# Patient Record
Sex: Female | Born: 1985 | Race: White | Hispanic: No | Marital: Married | Smoking: Never smoker
Health system: Southern US, Community
[De-identification: ages and names within clinical notes are randomized; demographics above are authoritative.]

---

## 2010-04-22 ENCOUNTER — Encounter: Payer: Self-pay | Admitting: Obstetrics and Gynecology

## 2010-08-26 ENCOUNTER — Observation Stay: Payer: Self-pay | Admitting: Obstetrics and Gynecology

## 2010-10-25 ENCOUNTER — Observation Stay: Payer: Self-pay

## 2010-10-26 ENCOUNTER — Inpatient Hospital Stay: Payer: Self-pay | Admitting: Obstetrics and Gynecology

## 2011-06-09 ENCOUNTER — Encounter: Payer: Self-pay | Admitting: Maternal & Fetal Medicine

## 2011-09-21 ENCOUNTER — Observation Stay: Payer: Self-pay

## 2011-09-21 LAB — URINALYSIS, COMPLETE
Bilirubin,UR: NEGATIVE
Blood: NEGATIVE
Glucose,UR: NEGATIVE mg/dL (ref 0–75)
Ketone: NEGATIVE
Ph: 6 (ref 4.5–8.0)
RBC,UR: 6 /HPF (ref 0–5)
Specific Gravity: 1.008 (ref 1.003–1.030)
Squamous Epithelial: 4

## 2011-09-21 LAB — FETAL FIBRONECTIN: Fetal Fibronectin: NEGATIVE

## 2011-09-22 LAB — URINE CULTURE

## 2011-11-16 ENCOUNTER — Inpatient Hospital Stay: Payer: Self-pay | Admitting: Obstetrics and Gynecology

## 2011-11-16 LAB — URINALYSIS, COMPLETE
Bilirubin,UR: NEGATIVE
Blood: NEGATIVE
Glucose,UR: NEGATIVE mg/dL (ref 0–75)
Nitrite: NEGATIVE
Ph: 6 (ref 4.5–8.0)
Protein: NEGATIVE
Specific Gravity: 1.017 (ref 1.003–1.030)

## 2011-11-16 LAB — CBC WITH DIFFERENTIAL/PLATELET
Basophil %: 0.1 %
Eosinophil #: 0 10*3/uL (ref 0.0–0.7)
Eosinophil %: 0.2 %
HCT: 31.2 % — ABNORMAL LOW (ref 35.0–47.0)
HGB: 10.4 g/dL — ABNORMAL LOW (ref 12.0–16.0)
Lymphocyte #: 1.8 10*3/uL (ref 1.0–3.6)
Lymphocyte %: 16.9 %
MCH: 29.2 pg (ref 26.0–34.0)
Monocyte #: 0.7 x10 3/mm (ref 0.2–0.9)
Neutrophil #: 8 10*3/uL — ABNORMAL HIGH (ref 1.4–6.5)
Neutrophil %: 76.5 %
WBC: 10.4 10*3/uL (ref 3.6–11.0)

## 2011-11-17 LAB — HEMATOCRIT: HCT: 28.6 % — ABNORMAL LOW (ref 35.0–47.0)

## 2012-09-21 ENCOUNTER — Ambulatory Visit: Payer: Self-pay | Admitting: Internal Medicine

## 2012-11-25 ENCOUNTER — Encounter: Payer: Self-pay | Admitting: Obstetrics & Gynecology

## 2013-02-06 ENCOUNTER — Observation Stay: Payer: Self-pay | Admitting: Obstetrics and Gynecology

## 2013-03-09 ENCOUNTER — Observation Stay: Payer: Self-pay

## 2013-03-09 LAB — FETAL FIBRONECTIN

## 2013-03-09 LAB — URINALYSIS, COMPLETE
Bacteria: NONE SEEN
Bilirubin,UR: NEGATIVE
Blood: NEGATIVE
Glucose,UR: NEGATIVE mg/dL (ref 0–75)
Ph: 6 (ref 4.5–8.0)
Specific Gravity: 1.006 (ref 1.003–1.030)
Squamous Epithelial: 1
WBC UR: 1 /HPF (ref 0–5)

## 2013-03-10 LAB — URINE CULTURE

## 2013-03-16 ENCOUNTER — Ambulatory Visit: Payer: Self-pay

## 2013-03-17 ENCOUNTER — Observation Stay: Payer: Self-pay | Admitting: Obstetrics and Gynecology

## 2013-03-17 LAB — FETAL FIBRONECTIN
Appearance: NORMAL
Fetal Fibronectin: NEGATIVE

## 2013-03-19 ENCOUNTER — Observation Stay: Payer: Self-pay | Admitting: Obstetrics and Gynecology

## 2013-03-19 LAB — CBC WITH DIFFERENTIAL/PLATELET
BANDS NEUTROPHIL: 3 %
HCT: 31.3 % — AB (ref 35.0–47.0)
HGB: 10.7 g/dL — AB (ref 12.0–16.0)
Lymphocytes: 17 %
MCH: 31.1 pg (ref 26.0–34.0)
MCHC: 34.2 g/dL (ref 32.0–36.0)
MCV: 91 fL (ref 80–100)
METAMYELOCYTE: 3 %
MONOS PCT: 7 %
PLATELETS: 227 10*3/uL (ref 150–440)
RBC: 3.44 10*6/uL — ABNORMAL LOW (ref 3.80–5.20)
RDW: 13 % (ref 11.5–14.5)
SEGMENTED NEUTROPHILS: 70 %
WBC: 16.8 10*3/uL — AB (ref 3.6–11.0)

## 2013-03-19 LAB — URINALYSIS, COMPLETE
BLOOD: NEGATIVE
Bacteria: NONE SEEN
Bilirubin,UR: NEGATIVE
GLUCOSE, UR: NEGATIVE mg/dL (ref 0–75)
Ketone: NEGATIVE
Nitrite: NEGATIVE
PH: 5 (ref 4.5–8.0)
Protein: NEGATIVE
Specific Gravity: 1.021 (ref 1.003–1.030)
WBC UR: 2 /HPF (ref 0–5)

## 2013-03-19 LAB — COMPREHENSIVE METABOLIC PANEL
AST: 23 U/L (ref 15–37)
Albumin: 2.9 g/dL — ABNORMAL LOW (ref 3.4–5.0)
Alkaline Phosphatase: 48 U/L
Anion Gap: 5 — ABNORMAL LOW (ref 7–16)
BUN: 11 mg/dL (ref 7–18)
Bilirubin,Total: 0.2 mg/dL (ref 0.2–1.0)
CALCIUM: 8.4 mg/dL — AB (ref 8.5–10.1)
CO2: 23 mmol/L (ref 21–32)
Chloride: 105 mmol/L (ref 98–107)
Creatinine: 0.39 mg/dL — ABNORMAL LOW (ref 0.60–1.30)
Glucose: 84 mg/dL (ref 65–99)
Osmolality: 265 (ref 275–301)
Potassium: 3.7 mmol/L (ref 3.5–5.1)
SGPT (ALT): 17 U/L (ref 12–78)
SODIUM: 133 mmol/L — AB (ref 136–145)
Total Protein: 6.8 g/dL (ref 6.4–8.2)

## 2013-03-19 LAB — LIPASE, BLOOD: LIPASE: 124 U/L (ref 73–393)

## 2013-03-19 LAB — RUPTURE OF MEMBRANE PLUS: Rom Plus: NOT DETECTED

## 2013-03-19 LAB — AMYLASE: AMYLASE: 41 U/L (ref 25–115)

## 2013-03-20 LAB — CBC WITH DIFFERENTIAL/PLATELET
Bands: 7 %
HCT: 27.8 % — ABNORMAL LOW (ref 35.0–47.0)
HGB: 9.6 g/dL — AB (ref 12.0–16.0)
Lymphocytes: 15 %
MCH: 31.5 pg (ref 26.0–34.0)
MCHC: 34.5 g/dL (ref 32.0–36.0)
MCV: 91 fL (ref 80–100)
Metamyelocyte: 2 %
Monocytes: 7 %
Platelet: 208 10*3/uL (ref 150–440)
RBC: 3.04 10*6/uL — AB (ref 3.80–5.20)
RDW: 13.2 % (ref 11.5–14.5)
Segmented Neutrophils: 69 %
WBC: 12.1 10*3/uL — AB (ref 3.6–11.0)

## 2013-03-20 LAB — BASIC METABOLIC PANEL
Anion Gap: 3 — ABNORMAL LOW (ref 7–16)
BUN: 8 mg/dL (ref 7–18)
Calcium, Total: 6.2 mg/dL — CL (ref 8.5–10.1)
Chloride: 108 mmol/L — ABNORMAL HIGH (ref 98–107)
Co2: 28 mmol/L (ref 21–32)
Creatinine: 0.41 mg/dL — ABNORMAL LOW (ref 0.60–1.30)
EGFR (African American): >60
Glucose: 93 mg/dL (ref 65–99)
Osmolality: 276 (ref 275–301)
POTASSIUM: 3.6 mmol/L (ref 3.5–5.1)
Sodium: 139 mmol/L (ref 136–145)

## 2013-03-20 LAB — MAGNESIUM: Magnesium: 5.8 mg/dL — ABNORMAL HIGH

## 2013-03-21 LAB — URINE CULTURE

## 2013-03-31 ENCOUNTER — Observation Stay: Payer: Self-pay | Admitting: Obstetrics and Gynecology

## 2013-04-07 ENCOUNTER — Observation Stay: Payer: Self-pay | Admitting: Obstetrics and Gynecology

## 2013-04-10 LAB — URINALYSIS, COMPLETE
BLOOD: NEGATIVE
Bilirubin,UR: NEGATIVE
GLUCOSE, UR: NEGATIVE mg/dL (ref 0–75)
Ketone: NEGATIVE
Nitrite: NEGATIVE
PROTEIN: NEGATIVE
Ph: 7 (ref 4.5–8.0)
Specific Gravity: 1.013 (ref 1.003–1.030)
Squamous Epithelial: 3
WBC UR: 4 /HPF (ref 0–5)

## 2013-04-11 ENCOUNTER — Inpatient Hospital Stay: Payer: Self-pay | Admitting: Obstetrics and Gynecology

## 2013-04-11 LAB — CBC WITH DIFFERENTIAL/PLATELET
Basophil #: 0 10*3/uL (ref 0.0–0.1)
Basophil %: 0.3 %
Eosinophil #: 0.1 10*3/uL (ref 0.0–0.7)
Eosinophil %: 0.5 %
HCT: 29.4 % — AB (ref 35.0–47.0)
HGB: 9.9 g/dL — ABNORMAL LOW (ref 12.0–16.0)
LYMPHS ABS: 2 10*3/uL (ref 1.0–3.6)
LYMPHS PCT: 18.1 %
MCH: 30.8 pg (ref 26.0–34.0)
MCHC: 33.7 g/dL (ref 32.0–36.0)
MCV: 91 fL (ref 80–100)
MONO ABS: 0.5 x10 3/mm (ref 0.2–0.9)
Monocyte %: 4.9 %
NEUTROS PCT: 76.2 %
Neutrophil #: 8.4 10*3/uL — ABNORMAL HIGH (ref 1.4–6.5)
Platelet: 181 10*3/uL (ref 150–440)
RBC: 3.21 10*6/uL — ABNORMAL LOW (ref 3.80–5.20)
RDW: 13.9 % (ref 11.5–14.5)
WBC: 11 10*3/uL (ref 3.6–11.0)

## 2013-04-12 LAB — HEMATOCRIT: HCT: 30 % — ABNORMAL LOW (ref 35.0–47.0)

## 2013-04-13 LAB — PATHOLOGY REPORT

## 2014-06-27 NOTE — Discharge Summary (Signed)
PATIENT NAME:  Kristine RighterREAVIS, Jelena L MR#:  [id number] DATE OF BIRTH:  04-Sep-1985  DATE OF ADMISSION:  11/16/2011 DATE OF DISCHARGE:  11/20/2011  PRINCIPLE PROCEDURE: Repeat cesarean section at [redacted] weeks gestation.   HOSPITAL COURSE: Uncomplicated. Postoperative day number one hematocrit 28.6%. She is discharged to home on postoperative day number four in good condition. She will follow-up Dr. Feliberto GottronSchermerhorn in two weeks for wound care or before if she has wound drainage, fever, nausea, or vomiting.    ____________________________ Suzy Bouchardhomas J. Schermerhorn, MD tjs:rbg D: 12/01/2011 08:15:35 ET T: 12/02/2011 10:47:46 ET JOB#: [id number]  cc: Suzy Bouchardhomas J. Schermerhorn, MD, <Dictator> Suzy BouchardHOMAS J SCHERMERHORN MD ELECTRONICALLY SIGNED 12/06/2011 9:42

## 2014-06-27 NOTE — Op Note (Signed)
PATIENT NAME:  Kristine Owen, Kristine Owen MR#:  [id number] DATE OF BIRTH:  [birth year]  DATE OF PROCEDURE:  11/16/2011  PREOPERATIVE DIAGNOSIS:  1. [redacted] weeks gestation.  2. Regular painful contractions unresponsive to terbutaline.  3. Previous cesarean section.   POSTOPERATIVE DIAGNOSIS:  1. [redacted] weeks gestation. 2. Regular painful contractions unresponsive to terbutaline. 3. Previous cesarean section.   PROCEDURE:  1. Repeat low transverse cesarean section.  2. On-Q pump placement.   ANESTHESIA: Spinal.   SURGEON: Suzy Bouchardhomas J. Schermerhorn, MD   FIRST ASSISTANT: Gordy, Scrub Tech   INDICATIONS: A 29 year old gravida 2, para 1 patient at 1235 + 0 weeks estimated gestational age came in complaining of painful contractions which persisted after two doses of subcutaneous terbutaline. The patient graded her contraction pain as 8 out of 10. The patient is with a prior cesarean section.   DESCRIPTION OF PROCEDURE: After spinal anesthesia, the patient was placed in the dorsal supine position. The abdomen was prepped and draped in normal sterile fashion. Cicatrix was removed sharply, and sharp dissection was used to identify fascia, which was opened in the midline and opened in a transverse fashion. The superior aspect of the fascia was grasped with Kocher clamps and the recti muscles dissected free. The inferior aspect of the fascia was grasped with Kocher clamps and the pyramidalis muscle was dissected free. Entry into the peritoneum cavity was accomplished sharply. There was extremely attenuated lower uterine segment with membranes beneath the previous scar. No frank rupture was noted, however. A uterine incision was made and immediately entered into the endometrial cavity, which resulted with clear fluid. The excision was extended with blunt transverse traction. The fetal head was brought to the incision and a vacuum applied to the occiput. One gentle pull allowed for delivery of the head. The vacuum was removed.  A loose nuchal cord was reduced. Fetal shoulders and body delivered without difficulty. A vigorous female was passed to Dr. Awanda MinkGaliote, who assigned Apgar scores of 8 and 8. Intravenous Pitocin was administered, and the placenta was manually delivered, and the uterus was exteriorized. The endometrial cavity was wiped clean with a laparotomy tape, and the cervix was opened with a ring forceps and this was passed off the operative field. The uterine incision was closed with 1 chromic suture in a running locking fashion with good approximation of edges. Good hemostasis was noted. The fallopian tubes and ovaries appeared normal. The posterior cul-de-sac was irrigated and suctioned. The uterus was placed back into the abdominal cavity, and the paracolic gutters were wiped clean with a laparotomy tape. The uterine incision again appeared hemostatic. Interceed was placed over the uterine incision in a T-shaped fashion. The On-Q pump system was brought out to the operative field. Catheter were placed from the infraumbilical region and were tunneled subfascially. The fascia was then closed over top of these catheters without difficulty with 0 Vicryl suture. Subcutaneous tissues were irrigated and bovied. The skin was reapproximated with staples. There were no complications. Estimated blood loss was 500 mL. Intraoperative fluids were 1500 mL. The patient did receive 2 grams IV Ancef prior to commencement of the case.   ____________________________ Suzy Bouchardhomas J. Schermerhorn, MD tjs:cbb D: 11/16/2011 16:50:53 ET T: 11/17/2011 09:44:04 ET JOB#: [id number]  cc: Suzy Bouchardhomas J. Schermerhorn, MD, <Dictator> Suzy BouchardHOMAS J SCHERMERHORN MD ELECTRONICALLY SIGNED 11/18/2011 19:36

## 2014-07-01 NOTE — Consult Note (Signed)
PREGNANCY and LABOR:  Maternal Age 29   Gravida 3   Term Deliveries 1   Preterm Deliveries 1   Final EDD (dd-mmm-yy) 07-Jun-2013   Blood Type (Maternal) AB positive   Antibody Screen Results (Maternal) negative   Gonorrhea Results (Maternal) negative   Chlamydia Results (Maternal) negative   Hepatitis C Culture (Maternal) unknown   Herpes Results (Maternal) n/a   VDRL/RPR/Syphilis Results (Maternal) negative   Rubella Results (Maternal) immune   Hepatitis B Surface Antigen Results (Maternal) negative   Group B Strep Results Maternal (Result >5wks must be treated as unknown) unknown/result > 5 weeks ago    Additional Comments I met with patient and her husband to discuss pending delivery of their daughter, Cordelia Poche, at 25 6/7 weeks by repeat c-section in the setting of preterm labor.  Mrs. Bevens has received betamethasone recently during a prior admission for PTL.  We discussed delivery room management with emphasis on respiratory support, which could range from room air to CPAP or possibly intubation for surfactant administration.  We reviewed temperature regulation and feeding immaturity, with possible need for isolette and expected need for TPN and gavage feeds.  Mrs. Sian plans to provide expressed breast milk, and we discussed the importance of initiating pumping soon after delivery.  Increased risk for infection and for jaundice were also discussed.  All questions were answered.   Joana Reamer, MD   Thank you: Thank you for this consult..  Electronic Signatures: Rhona Raider (MD)  (Signed [phone] 13:14)  Authored: PREGNANCY and LABOR, ADDITIONAL COMMENTS, THANK YOU   Last Updated: 02-Feb-15 13:14 by Rhona Raider (MD)

## 2014-07-01 NOTE — Op Note (Signed)
PATIENT NAME:  Kristine Owen, Kristine Owen MR#:  [id number] DATE OF BIRTH:  1985/10/27  DATE OF PROCEDURE:  04/11/2013  PREOPERATIVE DIAGNOSES: 1.  Chronic pelvic pain.  2.  At 31 + 6 weeks estimated gestational age.  3.  Regular painful contractions.  4.  Elective permanent sterilization.   POSTOPERATIVE DIAGNOSES: 1.  Chronic pelvic pain.  2.  At 31 + 6 weeks estimated gestational age.  3.  Elective permanent sterilization.  4.  Pelvic congestion.   PROCEDURE: 1.  Repeat low transverse cesarean section.  2.  Bilateral distal salpingectomy.   ANESTHESIA: Spinal.   SURGEON: Suzy Bouchardhomas J. Darlene Brozowski, M.D.   FIRST ASSISTANT: Doman, scrub tech.   INDICATIONS: A 29 year old gravida 3, para 2 patient with approximately a 30-day history of severe chronic pelvic pain with multiple workups yielding no definitive diagnosis. The patient presented on the day of the procedure with regular painful contractions graded 9+ out of 10. The contractions were q.2 minutes, lasting for 1 minute. Patient's prior workup included pelvic MR x 2. The patient did receive intramuscular steroids in a prior to admission as well as a trial of IV magnesium sulfate with no avail to decrease her pain.   PROCEDURE: After adequate spinal anesthesia, the patient was placed in the dorsal supine position with a hip roll under the right side. The patient did receive 2 grams IV Ancef prior to commencement of the case. Pfannenstiel incision was made 2 fingerbreadths above the symphysis pubis with removal of old cicatrix. Sharp dissection was used to identify the fascia. The fascia was opened in the midline and opened in a transverse fashion. The superior aspect of the fascia was grasped with Kocher clamps and the fascia was dissected off the recti muscles. The inferior aspect of the fascia was grasped with Kocher clamps and the pyramidalis muscle was dissected free. Entry into the peritoneal cavity was accomplished sharply. The vesicouterine  peritoneal fold was identified and a bladder flap was created and the bladder was reflected inferiorly. The lower uterine segment was somewhat attenuated, but not abnormally so.  Low transverse uterine incision was made. Upon entry into the endometrial cavity, clear fluid resulted. The fetal head was brought to the incision and a vacuum was applied to the occiput. One gentle pull allowed for delivery of the head. The shoulders and body were delivered without difficulty. A vigorous female was passed to the neonatologist who assigned Apgar scores of 8 and 8. Placenta was manually delivered. The uterus was exteriorized. The cervix was opened with a sponge stick and the uterine incision was closed with a running 1 chromic suture with good approximation of the edges and good hemostasis noted. The posterior cul-de-sac was irrigated and suctioned.   Attention was then directed to the patient's right fallopian tube. Of note, the uterine vessels were extremely dilated and did not deflate with perpendicular traction to the abdomen. They remained extremely dilated. Fallopian tubes had a limited central portion and were slightly on traction. Therefore, it was opted that the patient's distal portion of fallopian tube was clamped with a Kelly clamp and the distal half of the fallopian tube was then excised and suture ligated with a 2-0 Vicryl suture. The pedicle was hemostatic. A similar procedure was repeated on the patient's   left fallopian tube. Again, the distal half of the fallopian tube was clamped with a Tresa EndoKelly and the fallopian tube was removed and the pedicle was ligated with 2-0 Vicryl suture. Ovaries appeared normal.   The uterus  was placed back into the abdominal cavity and the paracolic gutters were wiped clean with a laparotomy tape. The uterine incision and the tubal ligation pedicles were hemostatic. Interceed was placed on the uterine incision in a T-shaped fashion without difficulty. On-Q pump catheters were  brought onto the operative field and the superior aspect of the fascia was regrasped and the catheters were placed from an infraumbilical position to a subfascial position. The fascia was then closed over top of these 2 catheters with 0 Vicryl suture in a running nonlocking fashion. Good approximation of edges was noted. Subcutaneous tissues were scarred down. These areas were freed up with the Bovie and the subcutaneous tissues were irrigated and bovied. Skin was reapproximated with staples. Each of the On-Q pump catheters were secured at the skin level with Dermabond and Steri-Strip and Tegaderm were placed overtop this. Each catheter was loaded with 5 mL of 0.5% Marcaine. There were no complications. The patient tolerated the procedure well and was taken to the recovery room in good condition.    ESTIMATED BLOOD LOSS: 500 mL.   INTRAOPERATIVE FLUIDS:  2000 mL.   The patient was taken to the recovery room in good condition.    ____________________________ Suzy Bouchard, MD tjs:cs D: 04/11/2013 11:52:18 ET T: 04/11/2013 14:17:20 ET JOB#: [id number]  cc: Suzy Bouchard, MD, <Dictator> Suzy Bouchard MD ELECTRONICALLY SIGNED 04/15/2013 11:46

## 2014-07-01 NOTE — H&P (Signed)
PATIENT NAME:  Kristine Owen, Kristine Owen MR#:  [id number] DATE OF BIRTH:  1986-03-03  DATE OF ADMISSION:  03/19/2013  HISTORY OF PRESENT ILLNESS: This is a 29 year old gravida 3, para 2. The patient is at 8328 + 3 weeks' estimated gestational age. The patient has had a 10-day history of intermittent vague lower abdominal pain, initially seen by Milon Scorearon Jones, nurse midwife. Pain was intermittent, lower abdominal bilaterally. The patient was noted to have some contractions when she was evaluated in labor and delivery and returned to see Milon Scorearon Jones one week later at the clinic. The patient was started on Procardia, which she had a reaction to and therefore stopped the Procardia. Dr. Logan BoresEvans evaluated the patient 2 days ago and did a speculum exam and noted the patient had a vaginal yeast infection and placed her on Diflucan. The patient states that her discharge has been a little bit more watery, but she has ruled out for rupture of membranes with Dr. Logan BoresEvans' examination. The patient states the pain has gotten worse in the last 2 days. She has had 2 prior cesarean sections, and it was noted that at last cesarean section that the lower uterine segment was extremely attenuated. The patient also had an early delivery with her last delivery at 36 weeks due to increasing abdominal pain at 36 weeks. The patient's appetite has been down. She has not had a fever. She has had some constipation 10 days ago, but bowel movements have been normal since then.   PAST MEDICAL HISTORY: Obsessive thinking, otherwise unremarkable.  PAST SURGICAL HISTORY: C-sections x 2.   REVIEW OF SYSTEMS: Unremarkable.   FAMILY HISTORY: Unremarkable.   MEDICATIONS: Prenatal vitamins.  SOCIAL HISTORY: The patient does not smoke, does not drink.   PHYSICAL EXAMINATION: GENERAL: Well-developed, well-nourished white female in no acute distress.  VITAL SIGNS: Blood pressure 112/58, pulse 114, temperature 98.3.  LUNGS: Clear to auscultation.   CARDIOVASCULAR: Regular rate and rhythm.  BACK: No CVA tenderness.  ABDOMEN: Soft, slight tenderness to palpation bilaterally lower.  No rebound tenderness. Uterus is gravid, nontender.  PELVIC: External genitalia: Slight erythema. Vagina: Normal physiologic discharge with some particulate matter consistent with yeast. Negative Nitrazine. Negative pooling. Negative ferning and negative Valsalva. ROM plus testing performed. Cervix closed. Fetus out of the pelvis.   RADIOLOGY: Ultrasounds performed by me. Vertex presentation. Placenta is posterior. Os is clear. Amniotic fluid index 19.2 cm.  MR of the abdomen and pelvis performed at [facility] on 03/19/2013. No significant findings. Uterus is without fibroids. There is no indication of abruption. No intra-abdominal mass is identified and no evidence of significant scar tissue noted.   LABORATORY STUDIES: Metabolic panel: Sodium 133, potassium 3.7, sodium chloride 105, BUN 11, glucose 84. Amylase 41 and lipase 124. Total protein 6.8, albumin 2.9, AST 23 and ALT of 17. CBC: White blood count 16.8, hemoglobin 10.7, hematocrit 31.3, platelets 227,000. Fetal fibronectin performed on 01/(12/2013) 03/19/13 TEXT>> negative. Urinalysis performed on 03/19/2013. Specific gravity of 1.021, pH of 5.0, negative blood, protein, negative nitrites, trace leukocyte esterase, high-power field 1 RBC and 2 WBC.   External fetal monitoring: Fetal heart rate of 130s, mild variable decels, adequate variability, reassuring for 28 weeks' gestation. Uterine irritability noted on the toco.   ASSESSMENT: At 28 + 3 weeks' gestation with increasing lower abdominal pain with uterine irritability. Differential includes early chorioamnionitis. White blood count of 16.8 is most consistent at this point with her previously receiving steroids. The patient does not have abdominal tenderness at  this point. The patient was noted to have extremely attenuated uterine incision on last  cesarean section by me. There is a chance that the patient is having early separation of the uterine scar. However, we see no blood on the pelvic exam and no evidence at this point of uterine rupture. The patient states that she did not receive any relief when she was placed on Procardia, but looking back at her last records, the patient was delivered at 36 weeks due to increasing pain with contractions, somewhat similar to what is presenting now.   PLANS: Will admit the patient and will start her on IV magnesium sulfate to see if we can control uterine irritability. If her pain dissipates, then that would be most consistent with the etiology of her pain. At that point, consideration for trying Procardia again to limit uterine irritability. If patient develops a fever or white blood count elevates, then consideration will be given for amniocentesis to rule out an early chorioamnionitis. I will not place the patient on antibiotics at this point, as to possibly confuse the results from an amniocentesis if we pursued that. I will place the patient on Vicodin for pain control and will switch her IV fluids to D5 LR and will continue to follow the patient.   ____________________________ Suzy Bouchard, MD tjs:jcm D: 03/19/2013 15:01:51 ET T: 03/19/2013 15:28:01 ET JOB#: [id number]  cc: Suzy Bouchard, MD, <Dictator> Suzy Bouchard MD ELECTRONICALLY SIGNED 03/25/2013 8:34

## 2014-07-01 NOTE — Discharge Summary (Signed)
PATIENT NAME:  Kristine Owen, [name] MR#:  [id number] DATE OF BIRTH:  Mar 22, 1985  DATE OF ADMISSION:  04/11/2013 DATE OF DISCHARGE:  04/14/2013  PRINCIPAL PROCEDURE: Repeat low transverse cesarean section and bilateral tubal ligation.   HOSPITAL COURSE: The patient with unremitting pain and contractions. Underwent cesarean section. The patient's postoperative course was uncomplicated. She was discharged to home oh postop day number 3. Postop day number 1 hematocrit at 30%. She was discharged to home with Percocet and Motrin. The patient will follow-up with Dr. Feliberto GottronSchermerhorn in 2 weeks for wound care.  ____________________________ Suzy Bouchardhomas J. Shun Pletz, MD tjs:sb D: 04/21/2013 09:13:44 ET T: 04/21/2013 09:56:43 ET JOB#: [id number]  cc: Suzy Bouchardhomas J. Marcos Peloso, MD, <Dictator> Suzy BouchardHOMAS J [place] MD ELECTRONICALLY SIGNED 04/25/2013 11:55

## 2014-07-18 NOTE — H&P (Signed)
L&D Evaluation:  History:  HPI 29 y/o WF G3P1102 at 428 weeks   Presents with cramping and D/C   Patient's Medical History No Chronic Illness   Patient's Surgical History Previous C-Section   Medications Pre Natal Vitamins   Allergies NKDA   Social History none   Family History Non-Contributory   ROS:  ROS All systems were reviewed.  HEENT, CNS, GI, GU, Respiratory, CV, Renal and Musculoskeletal systems were found to be normal., except  as above   Exam:  Vital Signs stable   Mental Status clear   Abdomen gravid, non-tender   Estimated Fetal Weight Average for gestational age   Pelvic no external lesions, cl/hi   Mebranes Intact   FHT normal rate with no decels   Ucx irregular   Other wet mt = yeast NTZ neg.   Impression:  Impression uterine irritability with yeast vaginitis   Plan:  Comments Diflucan 150 with 48 hr repeat s/p BMet #2 this evening (ordered by Caren) Recheck in office Monday/prn Support Rx procardia 10 by Rolan Buccoaron for irritability   Electronic Signatures: Margaretha GlassingEvans, Ricky L (MD)  (Signed 08-Jan-15 18:50)  Authored: L&D Evaluation   Last Updated: 08-Jan-15 18:50 by Margaretha GlassingEvans, Ricky L (MD)

## 2014-07-18 NOTE — H&P (Signed)
L&D Evaluation:  History:  HPI 29 yo G3P2 at 31+6 week presented last pm with uterine ctx. Pt with miultiple visits for Chronic pelvic pain and is on Percocet . Ctx not controlled with terb and patient had a reaction to procardia and is no a 3 day course of Motrin . 2 prior c/s with noted attenuated LUS at last. No v ag bleeding or rom.Pt has received steroids in pst and underwent a 2 day course of Magnesium sulfate   Presents with abdominal pain   Patient's Medical History No Chronic Illness   Patient's Surgical History Previous C-Section  x2sulfa   Allergies sulfa   Social History none   Family History Non-Contributory   ROS:  ROS All systems were reviewed.  HEENT, CNS, GI, GU, Respiratory, CV, Renal and Musculoskeletal systems were found to be normal.   Exam:  Vital Signs stable   General no apparent distress   Mental Status clear   Chest clear   Heart normal sinus rhythm   Abdomen gravid, tender with contractions   Back no CVAT   Edema no edema   Reflexes 1+   Pelvic closed / 50 %   Mebranes Intact   FHT normal rate with no decels   Ucx regular, q 2 min palpate moderate by nursing   Length of each Contraction 1 seconds   Ucx Pain Scale 9-10+   Impression:  Impression regular CTX cjhronic pelvic pain 2 prior c/s   Plan:  Plan long discussion re retry procardia  vs proceeding to Repeat LTCSD and BTL. Pt reconfirms desire for BTL.   Electronic Signatures: Ailene Royal, Ihor Austinhomas J (MD)  (Signed 02-Feb-15 10:00)  Authored: L&D Evaluation   Last Updated: 02-Feb-15 10:00 by Suzy BouchardSchermerhorn, Ronni Osterberg J (MD)

## 2014-09-05 ENCOUNTER — Other Ambulatory Visit: Payer: Self-pay | Admitting: Family Medicine

## 2014-09-05 DIAGNOSIS — M545 Low back pain: Secondary | ICD-10-CM

## 2014-09-08 ENCOUNTER — Ambulatory Visit: Payer: Self-pay

## 2014-09-13 ENCOUNTER — Ambulatory Visit
Admission: RE | Admit: 2014-09-13 | Discharge: 2014-09-13 | Disposition: A | Discharge status: Home / Self Care | Payer: No Typology Code available for payment source | Source: Ambulatory Visit | Attending: Family Medicine | Admitting: Family Medicine

## 2014-09-13 DIAGNOSIS — N9489 Other specified conditions associated with female genital organs and menstrual cycle: Secondary | ICD-10-CM | POA: Diagnosis not present

## 2014-09-13 DIAGNOSIS — M545 Low back pain: Secondary | ICD-10-CM | POA: Insufficient documentation

## 2014-09-13 DIAGNOSIS — M4317 Spondylolisthesis, lumbosacral region: Secondary | ICD-10-CM | POA: Diagnosis not present

## 2015-04-15 ENCOUNTER — Emergency Department
Admission: EM | Admit: 2015-04-15 | Discharge: 2015-04-15 | Disposition: A | Discharge status: Home / Self Care | Payer: No Typology Code available for payment source | Attending: Emergency Medicine | Admitting: Emergency Medicine

## 2015-04-15 ENCOUNTER — Emergency Department: Payer: No Typology Code available for payment source

## 2015-04-15 ENCOUNTER — Encounter: Payer: Self-pay | Admitting: Radiology

## 2015-04-15 DIAGNOSIS — L03211 Cellulitis of face: Secondary | ICD-10-CM | POA: Insufficient documentation

## 2015-04-15 DIAGNOSIS — Z91040 Latex allergy status: Secondary | ICD-10-CM | POA: Diagnosis not present

## 2015-04-15 DIAGNOSIS — R220 Localized swelling, mass and lump, head: Secondary | ICD-10-CM | POA: Diagnosis present

## 2015-04-15 LAB — BASIC METABOLIC PANEL
Anion gap: 4 — ABNORMAL LOW (ref 5–15)
BUN: 13 mg/dL (ref 6–20)
CALCIUM: 9.4 mg/dL (ref 8.9–10.3)
CO2: 28 mmol/L (ref 22–32)
CREATININE: 0.69 mg/dL (ref 0.44–1.00)
Chloride: 108 mmol/L (ref 101–111)
GFR calc Af Amer: >60 mL/min (ref >60)
GFR calc non Af Amer: >60 mL/min (ref >60)
GLUCOSE: 95 mg/dL (ref 65–99)
Potassium: 3.7 mmol/L (ref 3.5–5.1)
Sodium: 140 mmol/L (ref 135–145)

## 2015-04-15 LAB — CBC WITH DIFFERENTIAL/PLATELET
BASOS PCT: 1 %
Basophils Absolute: 0 10*3/uL (ref 0–0.1)
EOS ABS: 0 10*3/uL (ref 0–0.7)
EOS PCT: 1 %
HCT: 39.3 % (ref 35.0–47.0)
Hemoglobin: 13.2 g/dL (ref 12.0–16.0)
Lymphocytes Relative: 29 %
Lymphs Abs: 1.9 10*3/uL (ref 1.0–3.6)
MCH: 31.2 pg (ref 26.0–34.0)
MCHC: 33.6 g/dL (ref 32.0–36.0)
MCV: 93 fL (ref 80.0–100.0)
MONO ABS: 0.4 10*3/uL (ref 0.2–0.9)
MONOS PCT: 7 %
Neutro Abs: 4.2 10*3/uL (ref 1.4–6.5)
Neutrophils Relative %: 62 %
PLATELETS: 244 10*3/uL (ref 150–440)
RBC: 4.23 MIL/uL (ref 3.80–5.20)
RDW: 13.3 % (ref 11.5–14.5)
WBC: 6.6 10*3/uL (ref 3.6–11.0)

## 2015-04-15 MED ORDER — CLINDAMYCIN HCL 300 MG PO CAPS
300.0000 mg | ORAL_CAPSULE | Freq: Three times a day (TID) | ORAL | Status: AC
Start: 2015-04-15 — End: ?

## 2015-04-15 MED ORDER — CLINDAMYCIN HCL 150 MG PO CAPS
300.0000 mg | ORAL_CAPSULE | Freq: Once | ORAL | Status: AC
  Administered 2015-04-15: 300 mg via ORAL
  Filled 2015-04-15: qty 2

## 2015-04-15 MED ORDER — IOHEXOL 300 MG/ML  SOLN
75.0000 mL | Freq: Once | INTRAMUSCULAR | Status: AC | PRN
  Administered 2015-04-15: 75 mL via INTRAVENOUS

## 2015-04-15 NOTE — ED Provider Notes (Signed)
[facility] Emergency Department Provider Note  ____________________________________________  Time seen: Approximately 3:56 PM  I have reviewed the triage vital signs and the nursing notes.   HISTORY  Chief Complaint Facial Swelling    HPI Kristine Owen is a 30 y.o. female who complains of 2 days of pain under the left eye associated with swelling in the area photophobia and blurry vision. This is been progressing for 2 days. Patient reports she also had a pimple on the left side of the nose which she put some warm compresses on the pebble popped and drained yellow-green pus which is a lot different than her usual white pus from her acne. Patient does not have any nasal stuffiness or sinus congestion. She does not have any pain with eye movement. Patient's vision is noticeably different from one eye to the other good I the right eye is 20/15 left eye is 20/50 on testing. Both eyes are mildly injected.    History reviewed. No pertinent past medical history.  There are no active problems to display for this patient.   No past surgical history on file.  Current Outpatient Rx  Name  Route  Sig  Dispense  Refill  . clindamycin (CLEOCIN) 300 MG capsule   Oral   Take 1 capsule (300 mg total) by mouth 3 (three) times daily.   30 capsule   0     Allergies Latex and Sulfa antibiotics  No family history on file.  Social History Social History  Substance Use Topics  . Smoking status: None  . Smokeless tobacco: None  . Alcohol Use: None    Review of Systems Constitutional: No fever/chills Eyes: No visual changes. ENT: No sore throat. Cardiovascular: Denies chest pain. Respiratory: Denies shortness of breath. Gastrointestinal: No abdominal pain.  No nausea, no vomiting.  No diarrhea.  No constipation. Genitourinary: Negative for dysuria. Musculoskeletal: Negative for back pain. Skin: Negative for rash. Neurological: Negative for headaches,  focal weakness or numbness.  10-point ROS otherwise negative.  ____________________________________________   PHYSICAL EXAM:  VITAL SIGNS: ED Triage Vitals  Enc Vitals Group     BP 04/15/15 1430 111/56 mmHg     Pulse Rate 04/15/15 1430 68     Resp 04/15/15 1430 18     Temp 04/15/15 1430 98.7 F (37.1 C)     Temp Source 04/15/15 1430 Oral     SpO2 04/15/15 1430 98 %     Weight 04/15/15 1430 130 lb (58.968 kg)     Height 04/15/15 1430  (1.6 m)     Head Cir --      Peak Flow --      Pain Score 04/15/15 1434 7     Pain Loc --      Pain Edu? --      Excl. in GC? --     Constitutional: Alert and oriented. Well appearing and in no acute distress. Eyes: Conjunctivae are normal. PERRL. EOMI. Head: Atraumatic. There is an area on the left side of the nose which is slightly red looks like it was a pimple drainage she described in the lower eyelid and the maxillary area of the left side of the face is puffy reddish and swollen slightly tender. Nose: No congestion/rhinnorhea. Mouth/Throat: Mucous membranes are moist.  Oropharynx non-erythematous. Neck: No stridor.   Cardiovascular: Normal rate, regular rhythm. Grossly normal heart sounds.  Good peripheral circulation. Respiratory: [facility]. Lungs CTAB. Gastrointestinal: Soft and nontender. No  distention. No abdominal bruits. No CVA tenderness. Musculoskeletal: No lower extremity tenderness nor edema.  No joint effusions. Neurologic:  Normal speech and language. No gross focal neurologic deficits are appreciated. No gait instability. Skin:  Skin is warm, dry and intact. No rash noted. Psychiatric: Mood and affect are normal. Speech and behavior are normal.  ____________________________________________   LABS (all labs ordered are listed, but only abnormal results are displayed)  Labs Reviewed  BASIC METABOLIC PANEL - Abnormal; Notable for the following:    Anion gap 4 (*)    All other  components within normal limits  CBC WITH DIFFERENTIAL/PLATELET   ____________________________________________  EKG   ____________________________________________  RADIOLOGY  CT shows no evidence of periorbital cellulitis per radiology ____________________________________________   PROCEDURES  Eye doctor comes in exam as patient finds there is no sign of periorbital cellulitis that looks okay per the eye doctor if she advises treating with Keflex. ____________________________________________   INITIAL IMPRESSION / ASSESSMENT AND PLAN / ED COURSE  Pertinent labs & imaging results that were available during my care of the patient were reviewed by me and considered in my medical decision making (see chart for details).  ____________________________________________   FINAL CLINICAL IMPRESSION(S) / ED DIAGNOSES  Final diagnoses:  Facial cellulitis      Arnaldo Natal, MD 04/17/15 [phone]

## 2015-04-15 NOTE — Discharge Instructions (Signed)
Cellulitis Cellulitis is an infection of the skin and the tissue beneath it. The infected area is usually red and tender. Cellulitis occurs most often in the arms and lower legs.  CAUSES  Cellulitis is caused by bacteria that enter the skin through cracks or cuts in the skin. The most common types of bacteria that cause cellulitis are staphylococci and streptococci. SIGNS AND SYMPTOMS   Redness and warmth.  Swelling.  Tenderness or pain.  Fever. DIAGNOSIS  Your health care provider can usually determine what is wrong based on a physical exam. Blood tests may also be done. TREATMENT  Treatment usually involves taking an antibiotic medicine. HOME CARE INSTRUCTIONS   Take your antibiotic medicine as directed by your health care provider. Finish the antibiotic even if you start to feel better.  Keep the infected arm or leg elevated to reduce swelling.  Apply a warm cloth to the affected area up to 4 times per day to relieve pain.  Take medicines only as directed by your health care provider.  Keep all follow-up visits as directed by your health care provider. SEEK MEDICAL CARE IF:   You notice red streaks coming from the infected area.  Your red area gets larger or turns dark in color.  Your bone or joint underneath the infected area becomes painful after the skin has healed.  Your infection returns in the same area or another area.  You notice a swollen bump in the infected area.  You develop new symptoms.  You have a fever. SEEK IMMEDIATE MEDICAL CARE IF:   You feel very sleepy.  You develop vomiting or diarrhea.  You have a general ill feeling (malaise) with muscle aches and pains.   This information is not intended to replace advice given to you by your health care provider. Make sure you discuss any questions you have with your health care provider.   Document Released: 12/04/2004 Document Revised: 11/15/2014 Document Reviewed: 05/12/2011 Elsevier Interactive  Patient Education 2016 [employer]  Take the clindamycin myosin 1 pill 3 times a day. Be careful looking to the diary of his becomes bad stop and come in. Please return tomorrow for recheck unless your face is markedly better. Return sooner if the patient continues to worsen increasing pain and swelling fever redness or feels sicker.

## 2015-04-15 NOTE — Consult Note (Signed)
Ophtho consult note  CC:  Pimple left nasal bridge HPI: 2 day history bump left nasal bridge. Applied hot compresses yesterday, was able to express some yellow/reen material through the skin.  No blood.  Tender face around the area and into the cheek.  Pt complains of light sensitivity and blurry vision in left eye.  No fevers or chills.  No complains right eye.  Vision uncorrected at near 20/25  20/30  IOP STP OU  Pupils:  Round reactive to light OU, no APD  External:  Small erythematous lesion left nasal bridge with mild edema, does not feel hot, tender onto cheek, but not erythematous. No pus expressed on palpation or though inferior punctum.  Eyelids normal OU Conjunctiva mild injection OU Cornea: clear OU, no abrasion Anterior chamber: deep and quiet OU, no cell or flare Iris wnl Phakic Undilated posterior segment 90D exam:  Nerve sharp and pink OU, no edema Macula normal no cme OU Vessels normal Periphery normal   CT scan pending.  Assessment/Plan  1.  Preseptal cellulitis OS:  Low possibility dacryoadenitis OS.  No signs of orbital cellulitis on exam. Low likelihood.   -- Keflex 500 TID x10 days.  -- follow up tomorrow 1pm [facility] [phone]   Marcelene Butte MD Vitreoretinal Surgery and Disease [facility]

## 2015-04-15 NOTE — ED Notes (Signed)
Visual acuity Right 20/15 Left 20/50

## 2015-04-15 NOTE — ED Notes (Signed)
Pt reports 2 days ago having a red raised area under left eye. States she had yellow-greenish drainage come from the area. For  past  2 days redness and swelling under her left eye. Left eye red and blurry vision to left eye per pt.

## 2016-09-30 IMAGING — CT CT ORBITS W/ CM
3 series · 14 of 47 positions shown, 16 images · IV contrast (omnipaque)
Comparison: None.

CLINICAL DATA: Red raised area under the left side. Yellowish to
greenish drainage from the area.

EXAM:
CT ORBITS WITH CONTRAST
TECHNIQUE: Multidetector CT imaging of the orbits was performed following the
bolus administration of intravenous contrast.
CONTRAST:  75mL OMNIPAQUE IOHEXOL 300 MG/ML  SOLN

[Series 2: orbits soft · axial · 0.34mm/px · z∈[-148,-64]mm · 8 of 50 slices shown, 10 images]
[im 4/50  brain]
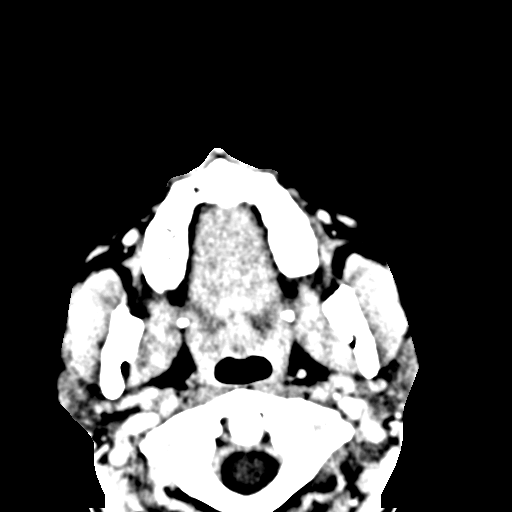
[im 4/50  bone]
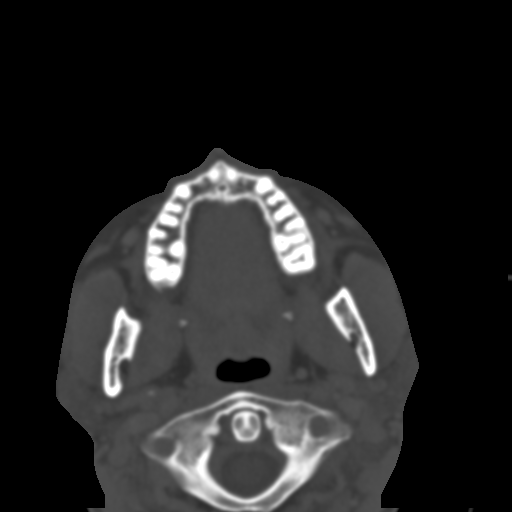
[im 11/50  bone]
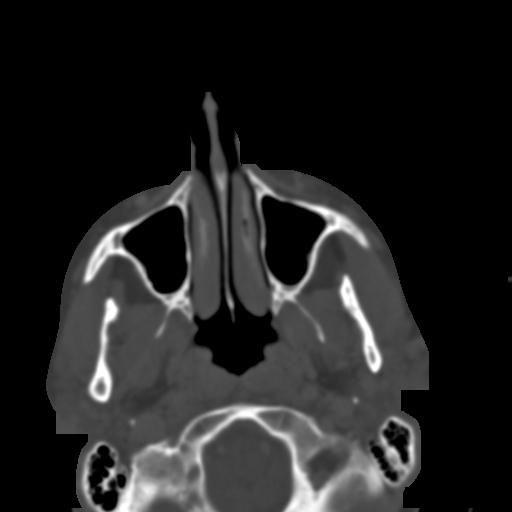
[im 16/50  bone]
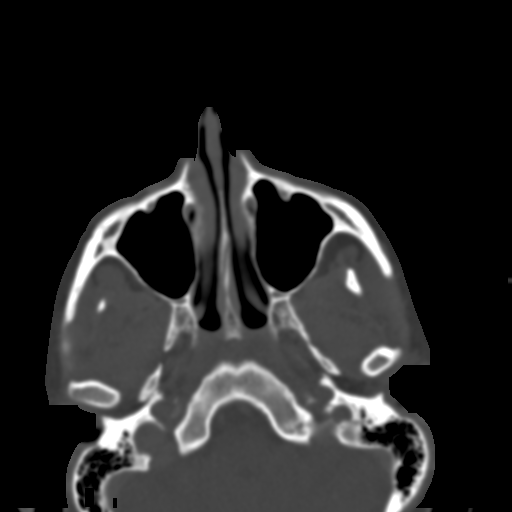
[im 22/50  bone]
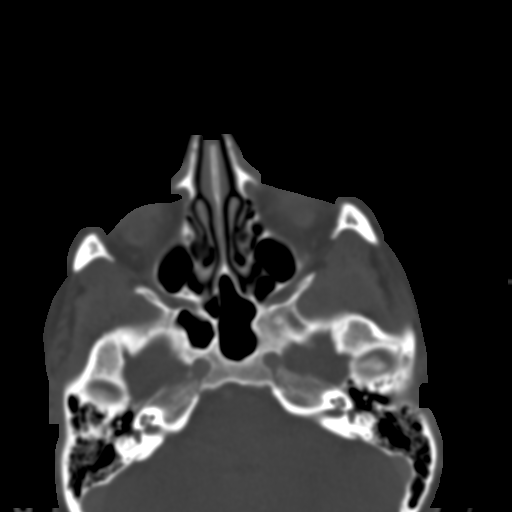
[im 28/50  brain]
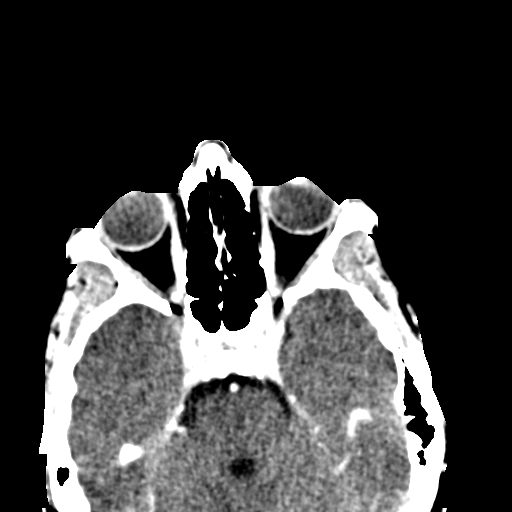
[im 28/50  bone]
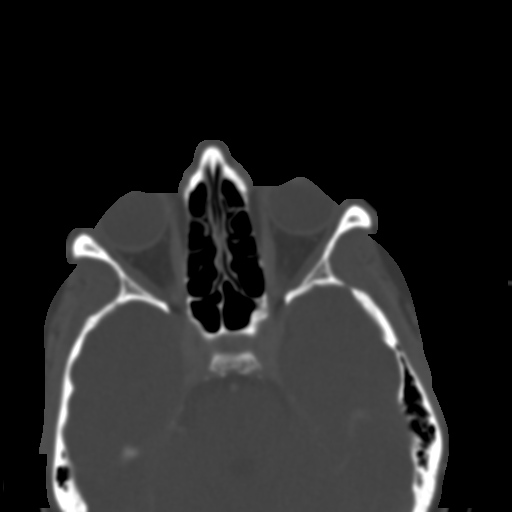
[im 34/50  bone]
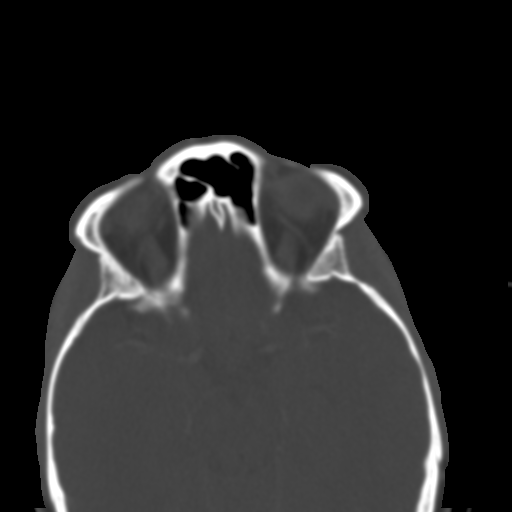
[im 39/50  bone]
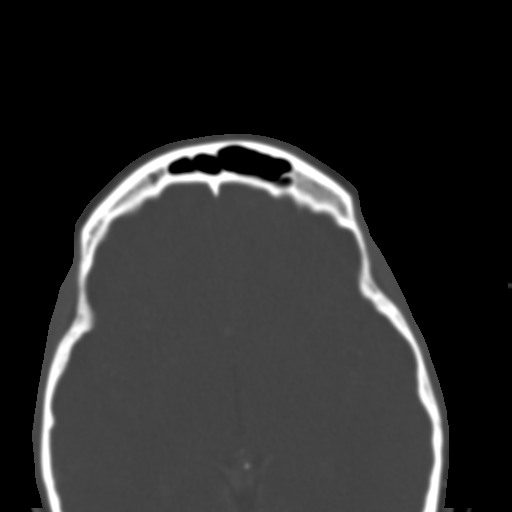
[im 46/50  bone]
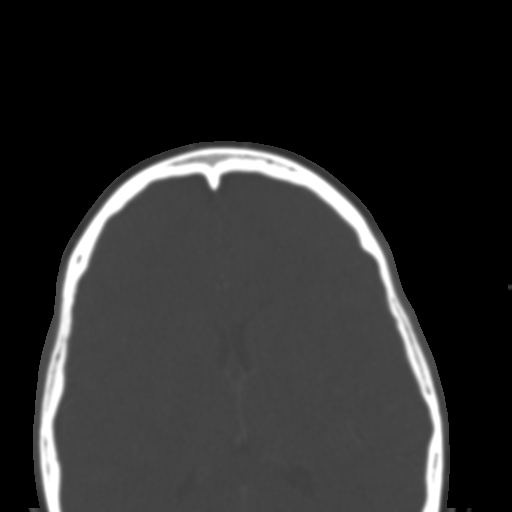

[Series 4: coronal soft · coronal · 0.24mm/px · 3 of 87 slices shown]
[im 29/87  bone]
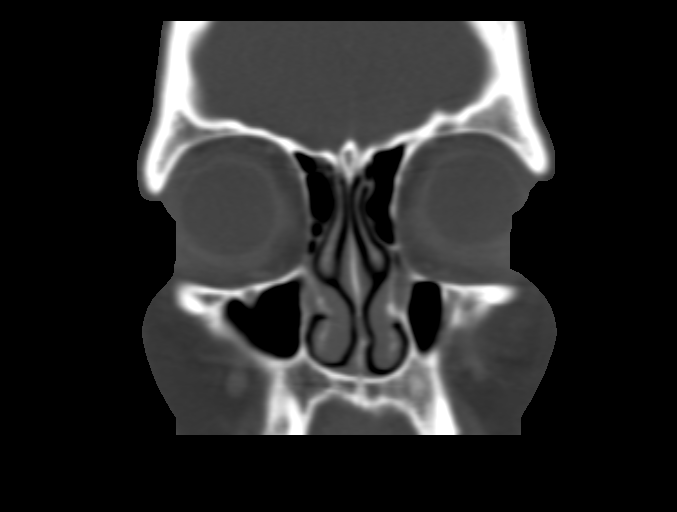
[im 39/87  bone]
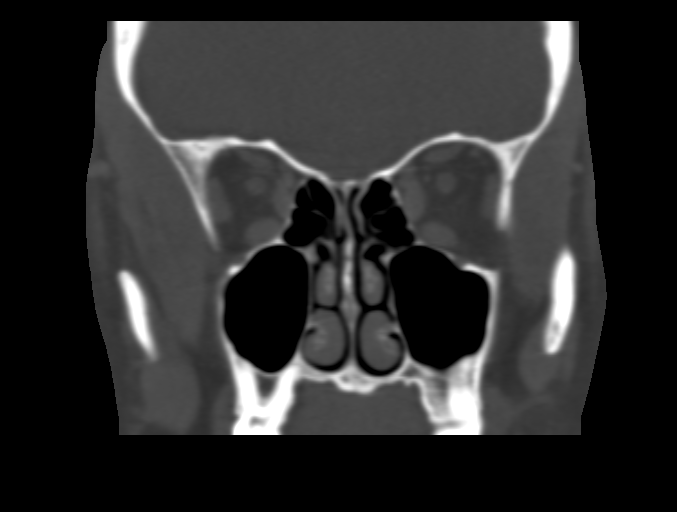
[im 48/87  bone]
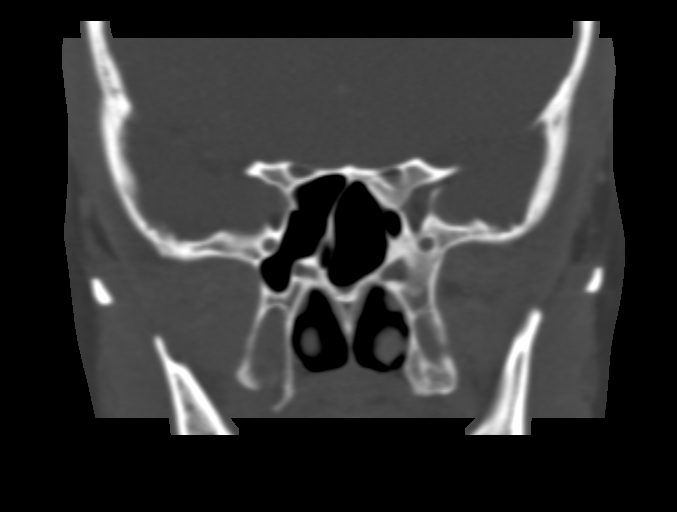

[Series 5: sagittal soft · sagittal · 0.22mm/px · 3 of 80 slices shown]
[im 27/80  bone]
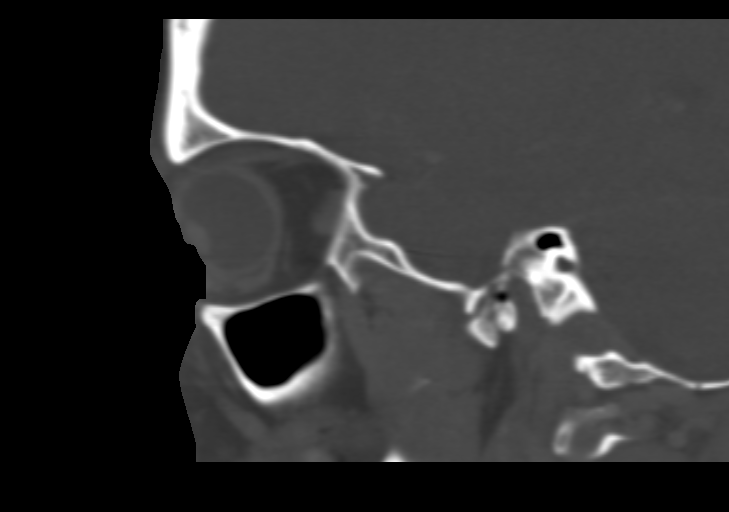
[im 40/80  bone]
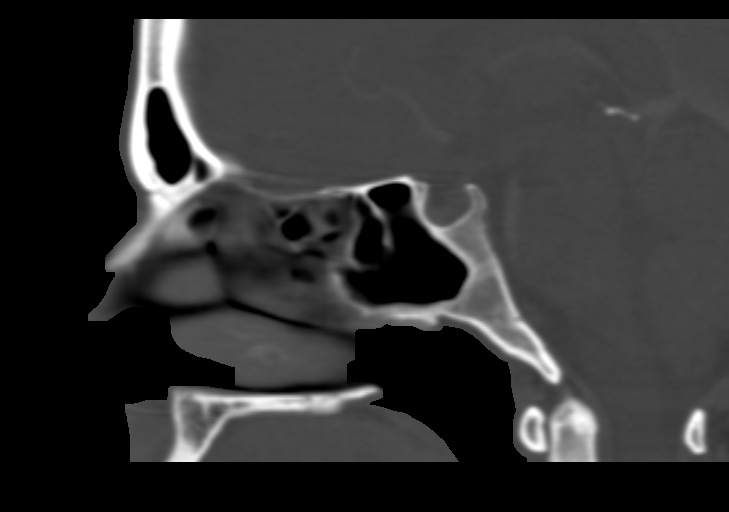
[im 53/80  bone]
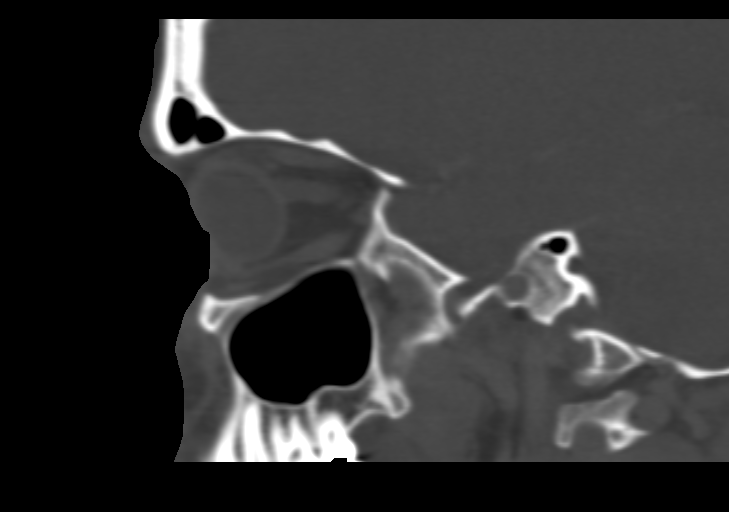

[14 of 47 positions shown; findings below may reference images not displayed]

FINDINGS: The globe appears intact without evidence of internal hemorrhage.
The optic nerve is normal in course. The intraconal fat is normal.
Extraocular muscles, superior ophthalmic vein, lacrimal gland and
fossa, and orbital walls are normal. Paranasal sinuses are clear.
IMPRESSION: 1. Normal CT of the orbits. No drainable fluid collection to suggest
an abscess. No preseptal cellulitis.

## 2017-06-12 ENCOUNTER — Other Ambulatory Visit: Payer: Self-pay | Admitting: Internal Medicine

## 2017-06-12 DIAGNOSIS — M5430 Sciatica, unspecified side: Secondary | ICD-10-CM

## 2017-06-12 DIAGNOSIS — M419 Scoliosis, unspecified: Secondary | ICD-10-CM

## 2017-06-12 DIAGNOSIS — M549 Dorsalgia, unspecified: Secondary | ICD-10-CM

## 2017-06-22 ENCOUNTER — Ambulatory Visit: Payer: No Typology Code available for payment source

## 2022-06-24 ENCOUNTER — Other Ambulatory Visit: Payer: Self-pay | Admitting: Obstetrics and Gynecology

## 2022-06-24 DIAGNOSIS — Z1231 Encounter for screening mammogram for malignant neoplasm of breast: Secondary | ICD-10-CM

## 2022-07-16 ENCOUNTER — Ambulatory Visit
Admission: RE | Admit: 2022-07-16 | Discharge: 2022-07-16 | Disposition: A | Discharge status: Home / Self Care | Payer: PRIVATE HEALTH INSURANCE | Source: Ambulatory Visit | Attending: Obstetrics and Gynecology | Admitting: Obstetrics and Gynecology

## 2022-07-16 DIAGNOSIS — Z1231 Encounter for screening mammogram for malignant neoplasm of breast: Secondary | ICD-10-CM | POA: Insufficient documentation

## 2023-07-21 ENCOUNTER — Other Ambulatory Visit: Payer: Self-pay

## 2023-07-21 ENCOUNTER — Encounter: Payer: Self-pay | Admitting: Allergy and Immunology

## 2023-07-21 ENCOUNTER — Ambulatory Visit: Payer: Self-pay | Admitting: Allergy and Immunology

## 2023-07-21 VITALS — BP 98/58 | HR 106 | Temp 99.1°F | Ht 63.0 in | Wt 130.0 lb

## 2023-07-21 DIAGNOSIS — H1013 Acute atopic conjunctivitis, bilateral: Secondary | ICD-10-CM

## 2023-07-21 DIAGNOSIS — H1010 Acute atopic conjunctivitis, unspecified eye: Secondary | ICD-10-CM

## 2023-07-21 DIAGNOSIS — J301 Allergic rhinitis due to pollen: Secondary | ICD-10-CM | POA: Diagnosis not present

## 2023-07-21 MED ORDER — OLOPATADINE HCL 0.2 % OP SOLN: 1.0000 [drp] | OPHTHALMIC | 1 refills | Status: AC

## 2023-07-21 MED ORDER — CETIRIZINE-PSEUDOEPHEDRINE ER 5-120 MG PO TB12: 1.0000 | ORAL_TABLET | Freq: Two times a day (BID) | ORAL | 1 refills | Status: AC

## 2023-07-21 MED ORDER — RYALTRIS 665-25 MCG/ACT NA SUSP: 2.0000 | Freq: Two times a day (BID) | NASAL | 1 refills | Status: AC

## 2023-07-21 NOTE — Progress Notes (Unsigned)
 [place] - [place] - [place] - [place] - [place]   Dear [employer],  Thank you for referring Kristine Owen to the [facility] [place]  on 07/21/2023.   Below is a summation of this patient's evaluation and recommendations.  Thank you for your referral. I will keep you informed about this patient's response to treatment.   If you have any questions please do not hesitate to contact me.   Sincerely,  Fabienne Holter, MD Allergy / Immunology [facility] [place]    ______________________________________________________________________    NEW PATIENT NOTE  Referring Provider: Rey Catholic, MD Primary Provider: Rey Catholic, MD Date of office visit: 07/21/2023    Subjective:   Chief Complaint:  Kristine Owen (DOB: 1986-02-05) is a 38 y.o. female who presents to the clinic on 07/21/2023 with a chief complaint of Seasonal allergic rhinitis and Establish Care .     HPI: Kristine Owen presents to this clinic in evaluation of allergies.  She has a 12-year history of progressive problems with nasal congestion and sneezing and itchy rotary eyes and irritated throat and some coughing spring through fall season whenever she goes outdoors especially following exposure to pollens.  She does not have any other associated respiratory tract symptoms such as shortness of breath or chest tightness and she can smell and taste without any problem.  Therapy in the past has included the use of montelukast prescribed by [facility] which gave rise to significant CNS side effects and she discontinued this agent.  She is currently using Zyrtec-D and she has been doing so for the past 2 months which appears to help her somewhat.  She has tried some Flonase in the past which did not help her.  She will use a cool facemask on occasion and she will use Navage on occasion.  History reviewed. No pertinent past medical  history.  History reviewed. No pertinent surgical history.  Allergies as of 07/21/2023       Reactions   Latex Hives   Sulfa Antibiotics Swelling        Medication List    cetirizine 10 MG tablet Commonly known as: ZYRTEC Take 10 mg by mouth daily.   clindamycin  300 MG capsule Commonly known as: CLEOCIN  Take 1 capsule (300 mg total) by mouth 3 (three) times daily.   Multi-Vitamin tablet Take 1 tablet by mouth daily.   spironolactone 25 MG tablet Commonly known as: ALDACTONE Take 25 mg by mouth 2 (two) times daily.    Review of systems negative except as noted in HPI / PMHx or noted below:  Review of Systems  Constitutional: Negative.   HENT: Negative.    Eyes: Negative.   Respiratory: Negative.    Cardiovascular: Negative.   Gastrointestinal: Negative.   Genitourinary: Negative.   Musculoskeletal: Negative.   Skin: Negative.   Neurological: Negative.   Endo/Heme/Allergies: Negative.   Psychiatric/Behavioral: Negative.      Family History  Problem Relation Age of Onset   Allergic rhinitis Mother    Allergic rhinitis Brother    Allergic rhinitis Maternal Grandmother     Social History   Socioeconomic History   Marital status: Single    Spouse name: Not on file   Number of children: Not on file   Years of education: Not on file   Highest education level: Not on file  Occupational History   Not on file  Tobacco Use   Smoking status: Never  Passive exposure: Past   Smokeless tobacco: Never  Vaping Use   Vaping status: Not on file  Substance and Sexual Activity   Alcohol use: Never   Drug use: Never   Sexual activity: Not on file  Other Topics Concern   Not on file  Social History Narrative   Not on file   Social Drivers of Health   Financial Resource Strain: Low Risk  (06/30/2023)   Received from [facility]   Overall Financial Resource Strain (CARDIA)    Difficulty of Paying Living Expenses: Not hard at all  Food  Insecurity: No Food Insecurity (06/30/2023)   Received from [facility]   Hunger Vital Sign    Worried About Running Out of Food in the Last Year: Never true    Ran Out of Food in the Last Year: Never true  Transportation Needs: No Transportation Needs (06/30/2023)   Received from [facility] - Transportation    In the past 12 months, has lack of transportation kept you from medical appointments or from getting medications?: No    Lack of Transportation (Non-Medical): No  Physical Activity: Not on file  Stress: Not on file  Social Connections: Not on file  Intimate Partner Violence: Not on file    Environmental and Social history  Lives in a house with a dry environment, dog looking inside the household, no carpet in the bedroom, no plastic on the bed, no plastic on the pillow, no smoking ongoing with inside household.  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  Objective:   Vitals:   07/21/23 1407 07/21/23 1454  BP: (!) 98/58 (!) 98/58  Pulse: (!) 106   Temp: 99.1 F (37.3 C)   SpO2: 98%    Height: 5\' 3"  (160 cm) Weight: 130 lb (59 kg)  Physical Exam Constitutional:      Appearance: She is not diaphoretic.  HENT:     Head: Normocephalic.     Right Ear: Tympanic membrane, ear canal and external ear normal.     Left Ear: Tympanic membrane, ear canal and external ear normal.     Nose: Nose normal. No mucosal edema or rhinorrhea.     Mouth/Throat:     Pharynx: Uvula midline. No oropharyngeal exudate.  Eyes:     Conjunctiva/sclera: Conjunctivae normal.  Neck:     Thyroid: No thyromegaly.     Trachea: Trachea normal. No tracheal tenderness or tracheal deviation.  Cardiovascular:     Rate and Rhythm: Normal rate and regular rhythm.     Heart sounds: Normal heart sounds, S1 normal and S2 normal. No murmur heard. Pulmonary:     Effort: No respiratory distress.     Breath sounds: Normal breath sounds. No stridor. No  wheezing or rales.  Lymphadenopathy:     Head:     Right side of head: No tonsillar adenopathy.     Left side of head: No tonsillar adenopathy.     Cervical: No cervical adenopathy.  Skin:    Findings: No erythema or rash.     Nails: There is no clubbing.  Neurological:     Mental Status: She is alert.     Diagnostics: Allergy skin tests were performed.   Spirometry was performed and demonstrated an FEV1 of *** @ *** % of predicted. FEV1/FVC = ***  The patient had an Asthma Control Test with the following results:  .     Assessment and  Plan:    No diagnosis found.  Patient Instructions   1. Return for skin testing (no antihistamines)  2. Treat and prevent inflammation of airway:   A. Ryaltris - 2 sprays each nostril 1-2 times per day  3. If needed:   A. Zyrtec-D   B. Pataday - 1 drop easch eye 1 time per day  C. Nasal saline  4. Influenza = Tamiflu. Covid = Paxlovid   Fabienne Holter, MD Allergy / Immunology [facility] [place] 

## 2023-07-21 NOTE — Patient Instructions (Signed)
  1. Return for skin testing (no antihistamines)  2. Treat and prevent inflammation of airway:   A. Ryaltris - 2 sprays each nostril 1-2 times per day  3. If needed:   A. Zyrtec-D   B. Pataday - 1 drop easch eye 1 time per day  C. Nasal saline  4. Influenza = Tamiflu. Covid = Paxlovid

## 2023-07-22 ENCOUNTER — Encounter: Payer: Self-pay | Admitting: Allergy and Immunology

## 2023-07-28 ENCOUNTER — Ambulatory Visit: Admitting: Allergy and Immunology

## 2023-07-28 DIAGNOSIS — H1013 Acute atopic conjunctivitis, bilateral: Secondary | ICD-10-CM

## 2023-07-28 DIAGNOSIS — H1010 Acute atopic conjunctivitis, unspecified eye: Secondary | ICD-10-CM

## 2023-07-28 DIAGNOSIS — J301 Allergic rhinitis due to pollen: Secondary | ICD-10-CM | POA: Diagnosis not present

## 2023-07-29 NOTE — Progress Notes (Signed)
 Kristine Owen returns to this clinic for skin testing.  Allergy skin testing identified hypersensitivity against grasses and weeds.  Allergen avoidance measures were provided.

## 2023-08-25 ENCOUNTER — Ambulatory Visit: Admitting: Allergy and Immunology

## 2023-08-25 DIAGNOSIS — J309 Allergic rhinitis, unspecified: Secondary | ICD-10-CM

## 2023-08-25 NOTE — Patient Instructions (Incomplete)
   1. Continue a combination of H1 receptor blocker and H2 receptor blocker daily  2. Consider using oral albuterol in addition to histamine receptor blockers  2.  EpiPen, Benadryl, MD/ER evaluation for allergic reaction  3.  Obtain thyroid function test every 6 months  4.  Consider starting omalizumab injections  5.  If failure with omalizumab then consider starting dupilumab injections  6. Obtain 24-hour urine collection for histamine metabolites, 5 HIAA, leukotriene E4, prostaglandin F2 alpha  7. Obtain fall flu vaccine and RSV vaccine

## 2024-01-23 DIAGNOSIS — J209 Acute bronchitis, unspecified: Secondary | ICD-10-CM | POA: Diagnosis not present

## 2024-01-23 DIAGNOSIS — B3731 Acute candidiasis of vulva and vagina: Secondary | ICD-10-CM | POA: Diagnosis not present

## 2024-01-23 DIAGNOSIS — B9689 Other specified bacterial agents as the cause of diseases classified elsewhere: Secondary | ICD-10-CM | POA: Diagnosis not present

## 2024-01-23 DIAGNOSIS — Z03818 Encounter for observation for suspected exposure to other biological agents ruled out: Secondary | ICD-10-CM | POA: Diagnosis not present

## 2024-01-23 DIAGNOSIS — J019 Acute sinusitis, unspecified: Secondary | ICD-10-CM | POA: Diagnosis not present
